# Patient Record
Sex: Female | Born: 1955 | Race: White | Hispanic: No | Marital: Married | Smoking: Current every day smoker
Health system: Southern US, Community
[De-identification: ages and names within clinical notes are randomized; demographics above are authoritative.]

## PROBLEM LIST (undated history)

## (undated) HISTORY — PX: CHOLECYSTECTOMY: SHX55

## (undated) HISTORY — PX: TUBAL LIGATION: SHX77

---

## 2016-06-28 ENCOUNTER — Encounter (HOSPITAL_COMMUNITY): Payer: Self-pay | Admitting: Emergency Medicine

## 2016-06-28 ENCOUNTER — Emergency Department (HOSPITAL_COMMUNITY): Payer: Self-pay

## 2016-06-28 ENCOUNTER — Emergency Department (HOSPITAL_COMMUNITY)
Admission: EM | Admit: 2016-06-28 | Discharge: 2016-06-28 | Disposition: A | Discharge status: Home / Self Care | Payer: Self-pay | Attending: Emergency Medicine | Admitting: Emergency Medicine

## 2016-06-28 DIAGNOSIS — Y939 Activity, unspecified: Secondary | ICD-10-CM | POA: Insufficient documentation

## 2016-06-28 DIAGNOSIS — F17210 Nicotine dependence, cigarettes, uncomplicated: Secondary | ICD-10-CM | POA: Insufficient documentation

## 2016-06-28 DIAGNOSIS — Y92009 Unspecified place in unspecified non-institutional (private) residence as the place of occurrence of the external cause: Secondary | ICD-10-CM | POA: Insufficient documentation

## 2016-06-28 DIAGNOSIS — Y999 Unspecified external cause status: Secondary | ICD-10-CM | POA: Insufficient documentation

## 2016-06-28 DIAGNOSIS — M25511 Pain in right shoulder: Secondary | ICD-10-CM | POA: Insufficient documentation

## 2016-06-28 MED ORDER — ACETAMINOPHEN 325 MG PO TABS
650.0000 mg | ORAL_TABLET | Freq: Once | ORAL | Status: AC
  Administered 2016-06-28: 650 mg via ORAL
  Filled 2016-06-28: qty 2

## 2016-06-28 MED ORDER — NAPROXEN 250 MG PO TABS: 250.0000 mg | ORAL_TABLET | Freq: Two times a day (BID) | ORAL | 0 refills | Status: AC
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
## 2016-06-28 NOTE — ED Notes (Signed)
Patient transported to X-ray 

## 2017-06-14 DEATH — deceased

## 2017-09-20 IMAGING — CR DG SHOULDER 2+V*R*
3 series · 3 of 3 positions shown · non-contrast
Comparison: None.

CLINICAL DATA: Right shoulder pain following fall today, initial
encounter

EXAM:
RIGHT SHOULDER - 2+ VIEW

[shoulder grashey]
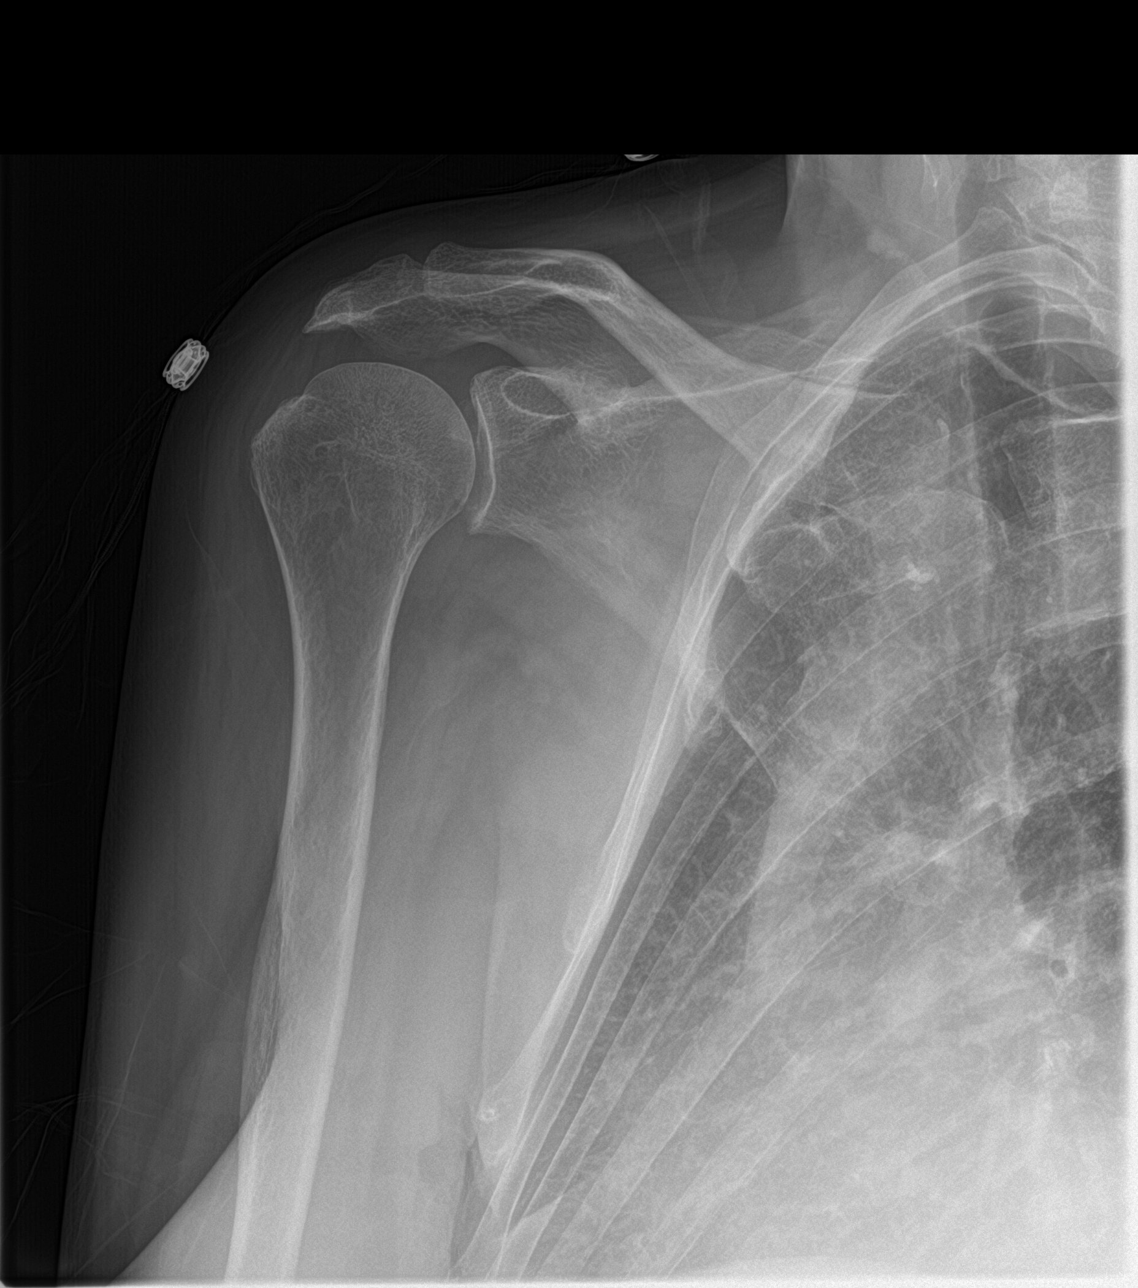

[shoulder y view]
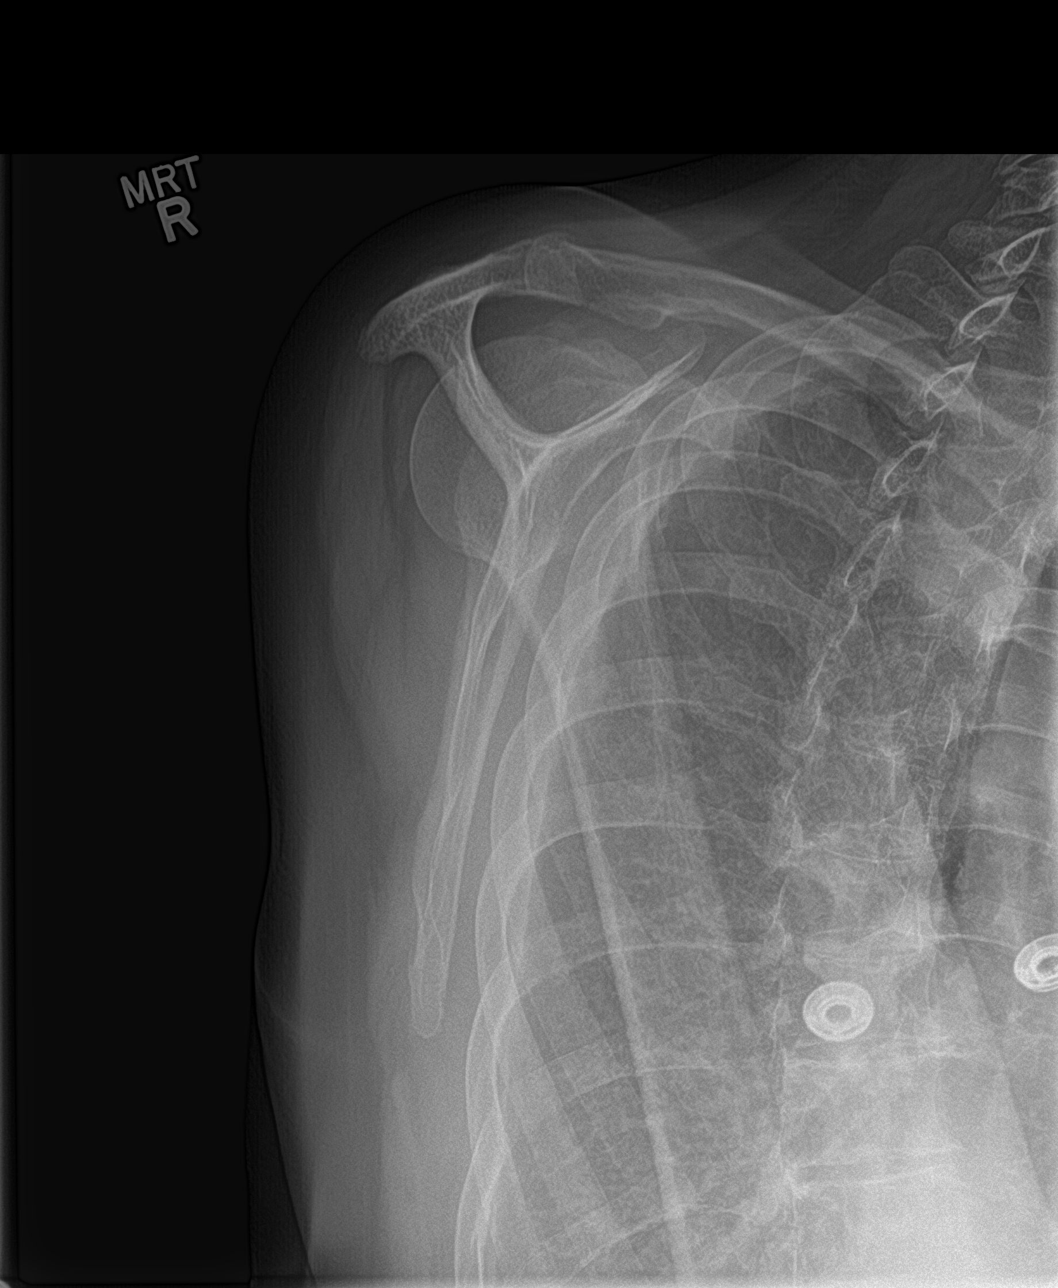

[shoulder axillary]
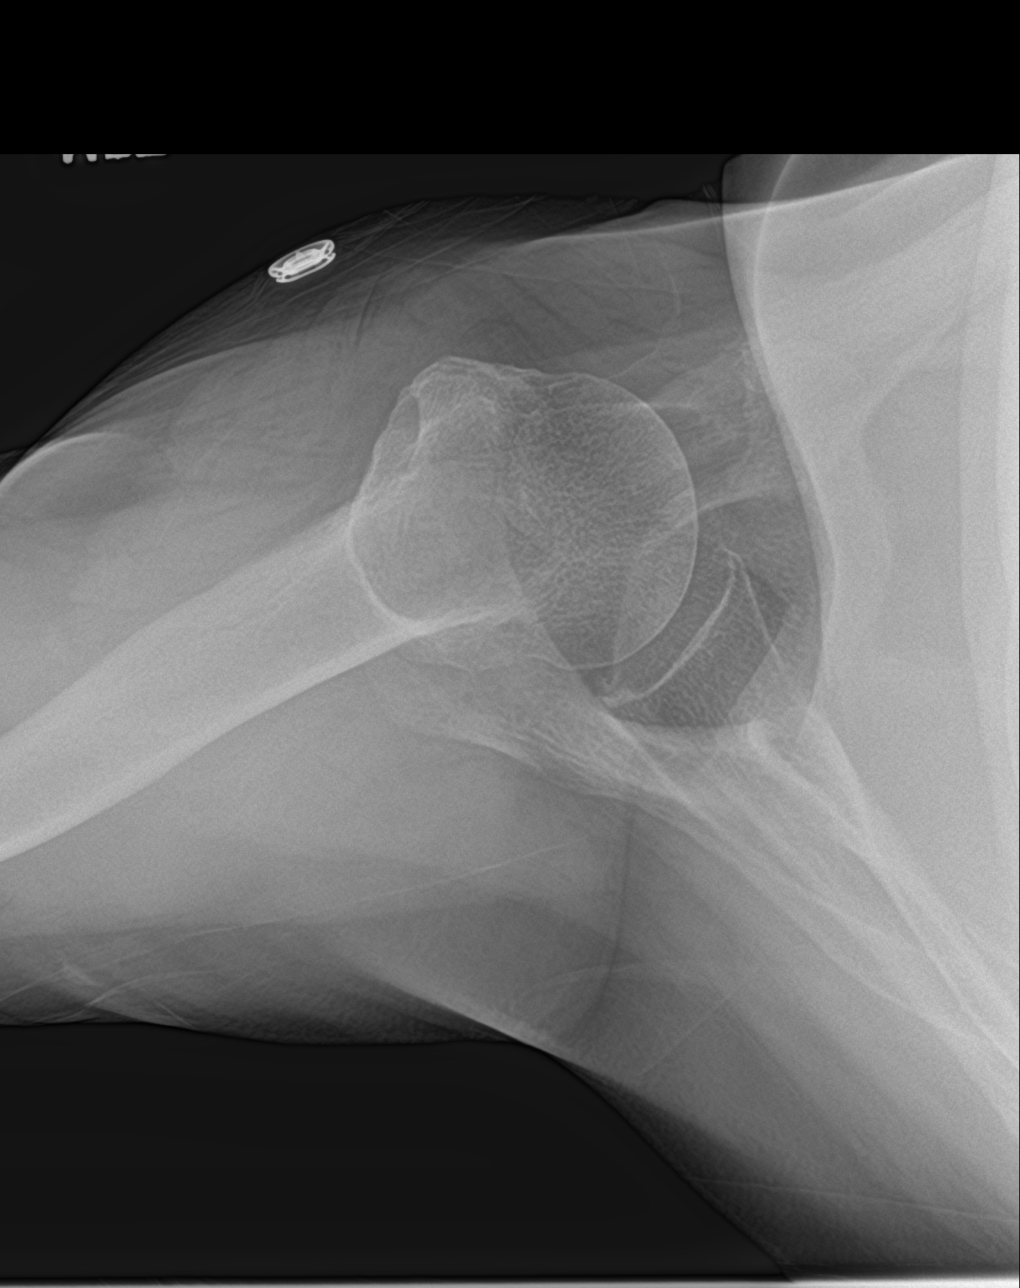

[3 of 3 positions shown; findings below may reference images not displayed]

FINDINGS: There is no evidence of fracture or dislocation. There is no
evidence of arthropathy or other focal bone abnormality. Soft
tissues are unremarkable.
IMPRESSION: No acute abnormality noted.
